# Patient Record
Sex: Female | Born: 1975 | Race: White | Hispanic: No | Marital: Married
Health system: Southern US, Community
[De-identification: ages and names within clinical notes are randomized; demographics above are authoritative.]

---

## 2003-01-17 ENCOUNTER — Other Ambulatory Visit: Admission: RE | Admit: 2003-01-17 | Discharge: 2003-01-17 | Payer: Self-pay | Admitting: Obstetrics and Gynecology

## 2003-01-18 ENCOUNTER — Ambulatory Visit (HOSPITAL_COMMUNITY): Admission: RE | Admit: 2003-01-18 | Discharge: 2003-01-18 | Payer: Self-pay | Admitting: Obstetrics and Gynecology

## 2003-01-18 ENCOUNTER — Other Ambulatory Visit: Admission: RE | Admit: 2003-01-18 | Discharge: 2003-01-18 | Payer: Self-pay | Admitting: Obstetrics and Gynecology

## 2003-01-18 ENCOUNTER — Encounter: Payer: Self-pay | Admitting: Obstetrics and Gynecology

## 2003-02-08 ENCOUNTER — Encounter: Payer: Self-pay | Admitting: Obstetrics and Gynecology

## 2003-02-08 ENCOUNTER — Ambulatory Visit (HOSPITAL_COMMUNITY): Admission: RE | Admit: 2003-02-08 | Discharge: 2003-02-08 | Payer: Self-pay | Admitting: Obstetrics and Gynecology

## 2003-06-17 ENCOUNTER — Inpatient Hospital Stay (HOSPITAL_COMMUNITY): Admission: AD | Admit: 2003-06-17 | Discharge: 2003-06-18 | Payer: Self-pay | Admitting: Obstetrics & Gynecology

## 2003-06-20 ENCOUNTER — Inpatient Hospital Stay (HOSPITAL_COMMUNITY): Admission: AD | Admit: 2003-06-20 | Discharge: 2003-06-22 | Payer: Self-pay | Admitting: Obstetrics and Gynecology

## 2008-09-27 ENCOUNTER — Ambulatory Visit (HOSPITAL_COMMUNITY): Admission: RE | Admit: 2008-09-27 | Discharge: 2008-09-27 | Payer: Self-pay | Admitting: Obstetrics and Gynecology

## 2008-09-27 IMAGING — US US OB DETAIL+14 WK
1 series · 14 of 28 positions shown · non-contrast
Comparison: none

OBSTETRICAL ULTRASOUND:
 This ultrasound exam was performed in the [HOSPITAL] Ultrasound Department.  The OB US report was generated in the AS system, and faxed to the ordering physician.  This report is also available in [REDACTED] PACS.

[Series 1: us ob detail +14 wk · 14 of 56 slices shown]
[im 3/56]
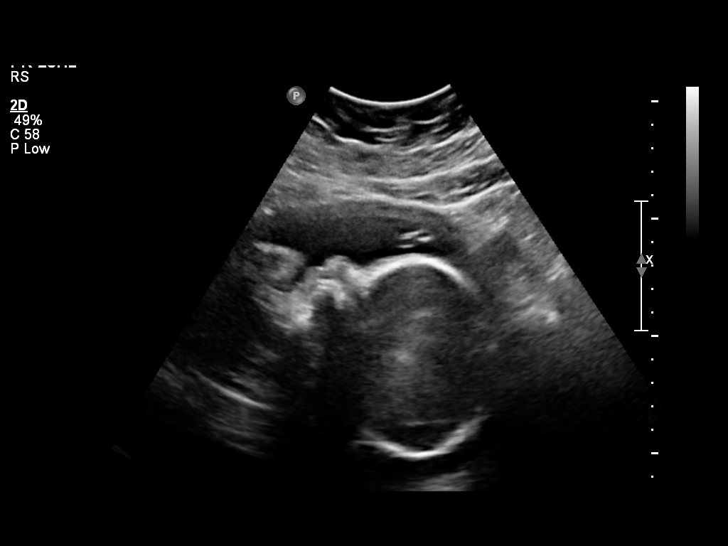
[im 7/56]
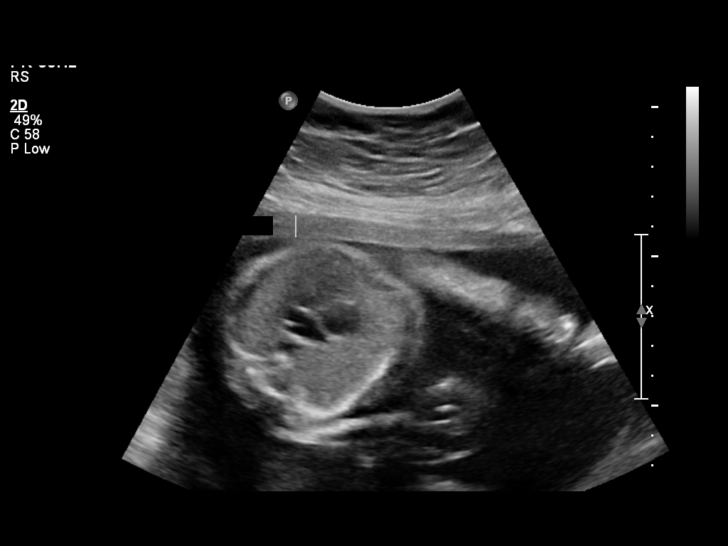
[im 11/56]
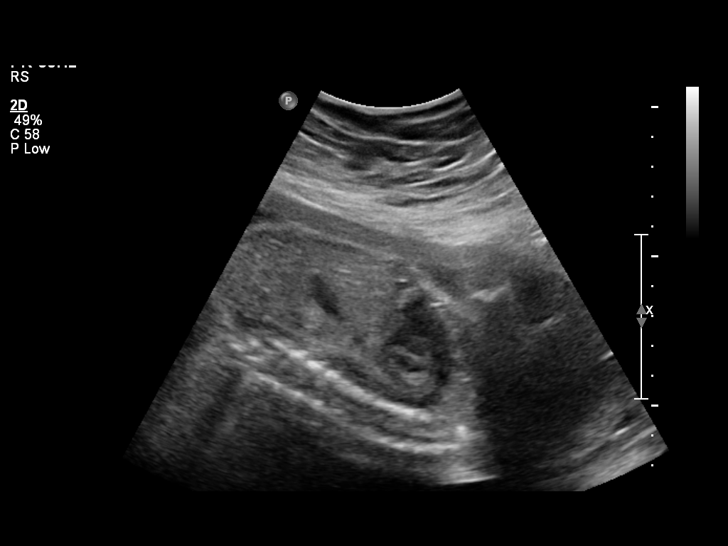
[im 15/56]
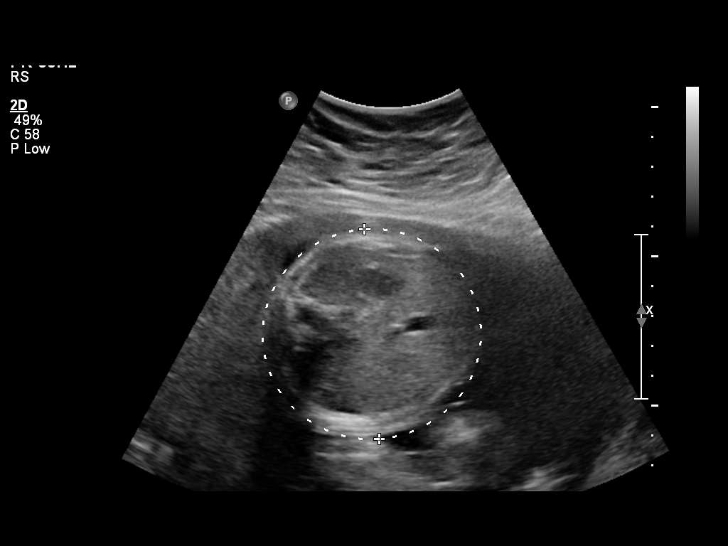
[im 19/56]
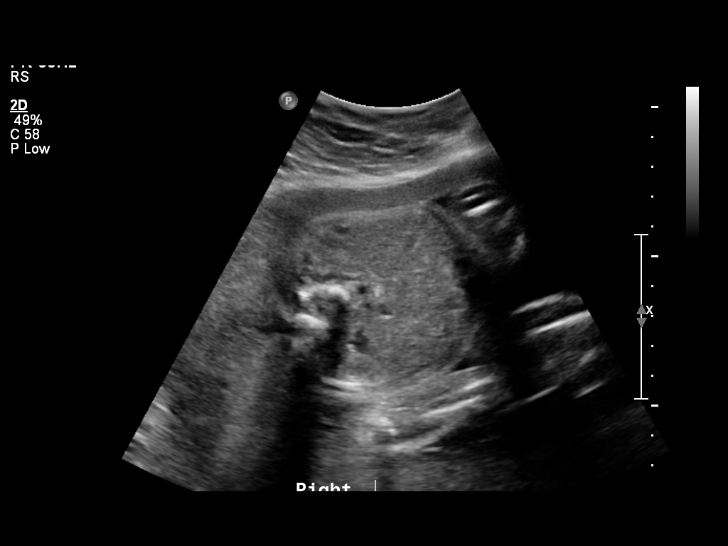
[im 23/56]
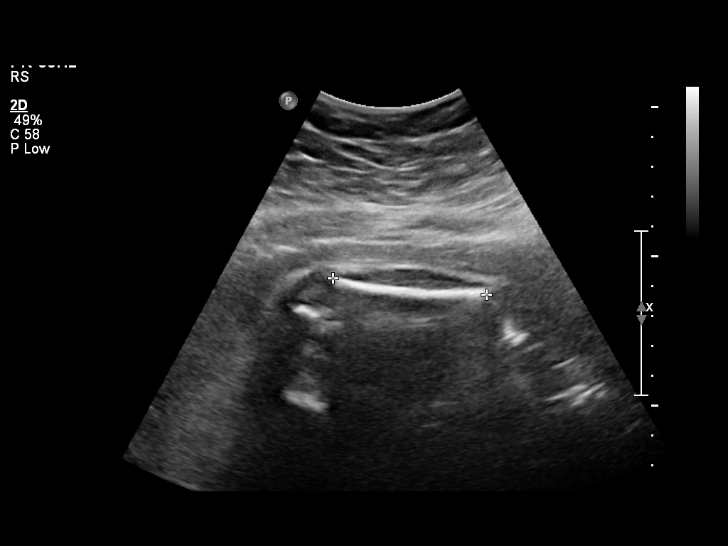
[im 27/56]
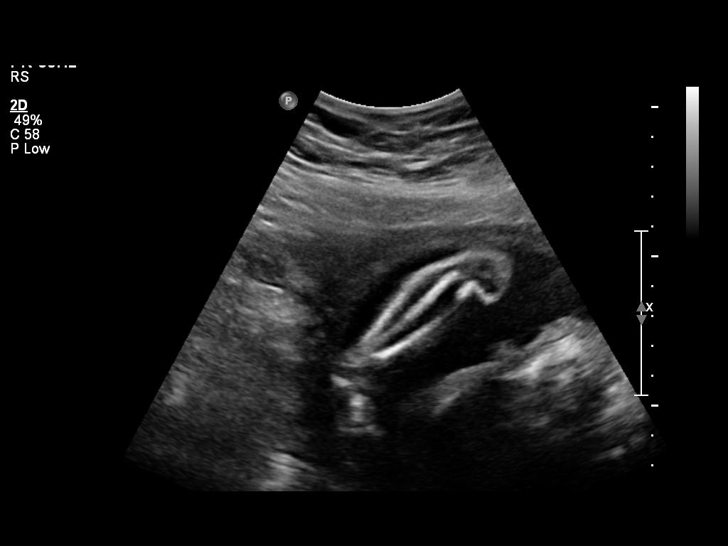
[im 31/56]
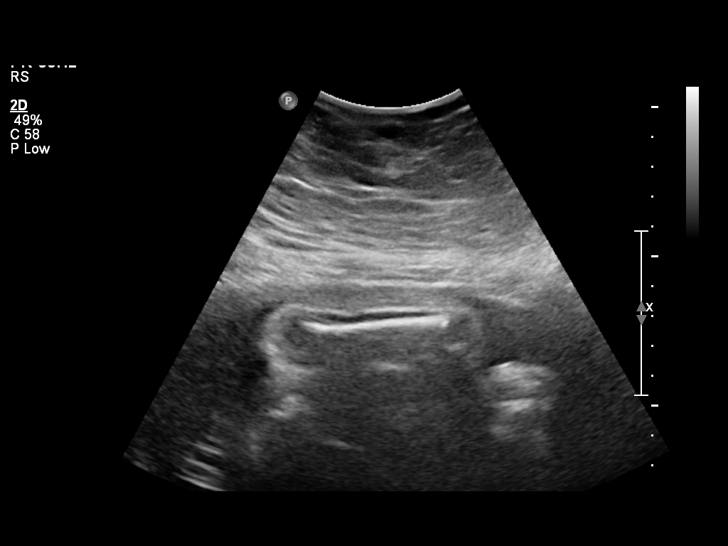
[im 35/56]
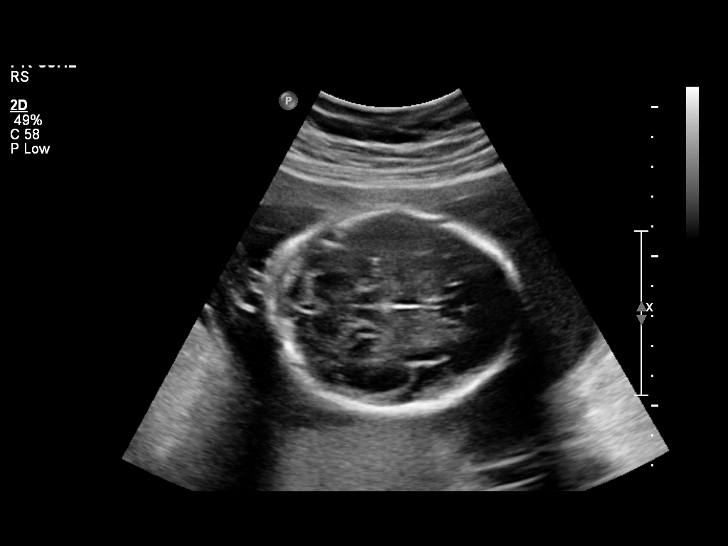
[im 39/56]
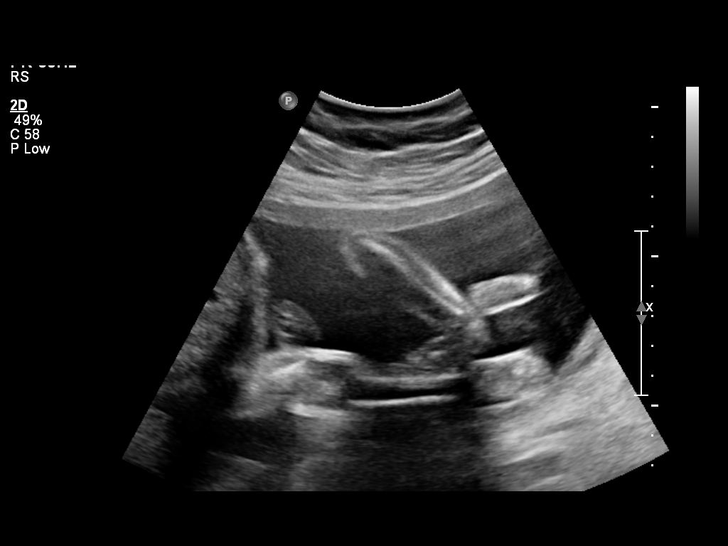
[im 43/56]
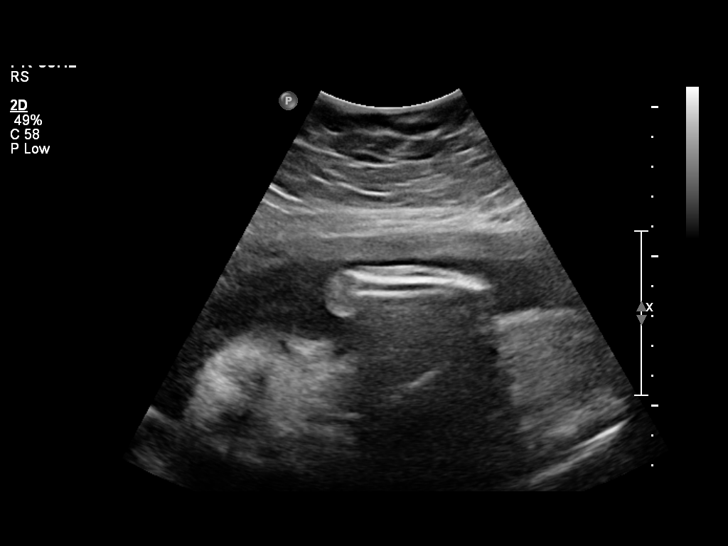
[im 47/56]
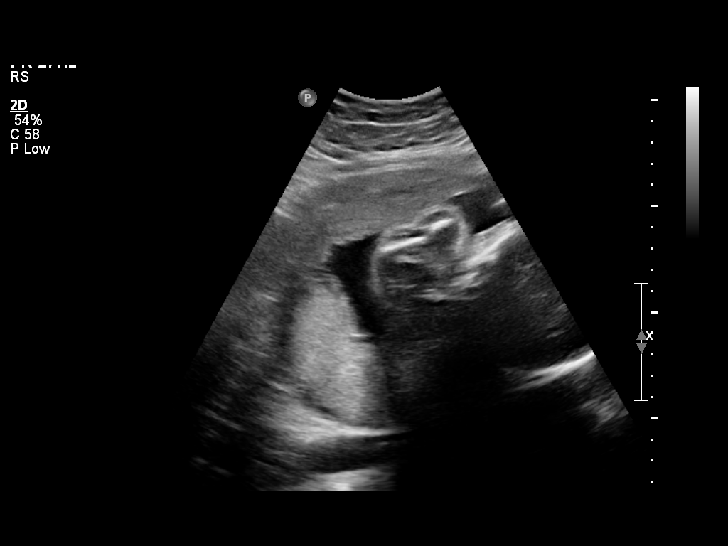
[im 51/56]
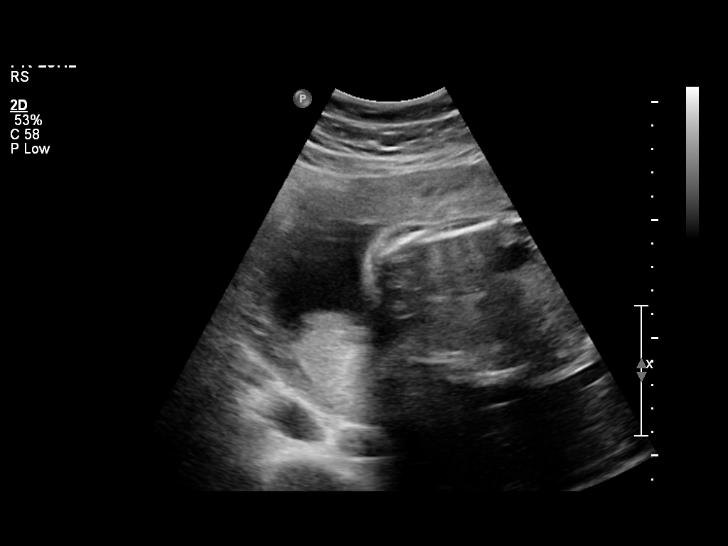
[im 56/56]
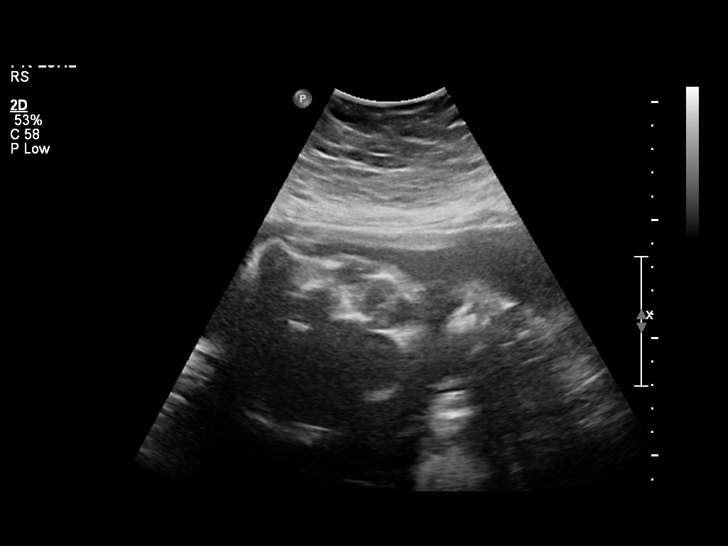

[14 of 28 positions shown; findings below may reference images not displayed]

IMPRESSION: See AS Obstetric US report.

## 2008-12-04 ENCOUNTER — Inpatient Hospital Stay (HOSPITAL_COMMUNITY): Admission: AD | Admit: 2008-12-04 | Discharge: 2008-12-06 | Payer: Self-pay | Admitting: Obstetrics and Gynecology

## 2008-12-06 ENCOUNTER — Ambulatory Visit: Payer: Self-pay | Admitting: Vascular Surgery

## 2008-12-06 ENCOUNTER — Encounter (INDEPENDENT_AMBULATORY_CARE_PROVIDER_SITE_OTHER): Payer: Self-pay | Admitting: Obstetrics & Gynecology

## 2010-09-21 LAB — CBC
HCT: 35 % — ABNORMAL LOW (ref 36.0–46.0)
Hemoglobin: 12.3 g/dL (ref 12.0–15.0)
MCHC: 35.3 g/dL (ref 30.0–36.0)
MCV: 89.5 fL (ref 78.0–100.0)
RBC: 3.56 MIL/uL — ABNORMAL LOW (ref 3.87–5.11)
RBC: 3.91 MIL/uL (ref 3.87–5.11)
WBC: 8.6 10*3/uL (ref 4.0–10.5)

## 2010-09-21 LAB — CCBB MATERNAL DONOR DRAW

## 2020-09-18 ENCOUNTER — Other Ambulatory Visit: Payer: Self-pay

## 2020-09-18 ENCOUNTER — Inpatient Hospital Stay: Payer: Self-pay | Attending: Hematology and Oncology | Admitting: Hematology and Oncology

## 2020-09-18 VITALS — BP 138/76 | HR 78 | Temp 98.4°F | Resp 18 | Ht 67.0 in | Wt 328.7 lb

## 2020-09-18 DIAGNOSIS — R928 Other abnormal and inconclusive findings on diagnostic imaging of breast: Secondary | ICD-10-CM

## 2020-09-18 NOTE — Progress Notes (Addendum)
Tanya [name] is a 45 y.o. female who presents to [facility] clinic today with no complaints .    Pap Smear: Pap not smear completed today. Last Pap smear was 08/20/2019 and was normal. Per patient has no history of an abnormal Pap smear. Last Pap smear result is not available in Epic.   Physical exam: Breasts Breasts symmetrical. No skin abnormalities bilateral breasts. No nipple retraction bilateral breasts. No nipple discharge bilateral breasts. No lymphadenopathy. No lumps palpated bilateral breasts.       Pelvic/Bimanual Pap is not indicated today    Smoking History: Patient has is a former smoker.    Patient Navigation: Patient education provided. Access to services provided for patient through [facility] program. No interpreter provided. No transportation provided   Colorectal Cancer Screening: Per patient has never had colonoscopy completed No complaints today.    Breast and Cervical Cancer Risk Assessment: Patient does not have family history of breast cancer, known genetic mutations, or radiation treatment to the chest before age 52. Patient does not have history of cervical dysplasia, immunocompromised, or DES exposure in-utero.  Risk Assessment   No risk assessment data     A: BCCCP exam without pap smear Clinical breast exam benign. 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  P: [facility] for [facility] bilateral diagnostic mammogram and right breast ultrasound. Appointment to be scheduled and patient will be notified.  Pascal Lux, NP 09/18/2020 1:55 PM

## 2020-10-28 ENCOUNTER — Encounter: Payer: Self-pay | Admitting: Hematology and Oncology

## 2020-10-29 ENCOUNTER — Encounter: Payer: Self-pay | Admitting: Hematology and Oncology

## 2022-02-17 ENCOUNTER — Inpatient Hospital Stay: Payer: Self-pay | Attending: Hematology and Oncology | Admitting: Hematology and Oncology

## 2022-02-17 ENCOUNTER — Encounter: Payer: Self-pay | Admitting: Hematology and Oncology

## 2022-02-17 NOTE — Progress Notes (Signed)
Ms. Tanya Tanya [name] is Tanya [name] 46 y.o. female who presents to [facility] clinic today with no complaints .    Pap Smear: Pap not smear completed today. Last Pap smear was 07/30/2019 at [facility] clinic and was normal. Per patient has no history of an abnormal Pap smear. Last Pap smear result is not available in Epic.   Physical exam: Breasts Breasts symmetrical. No skin abnormalities bilateral breasts. No nipple retraction bilateral breasts. No nipple discharge bilateral breasts. No lymphadenopathy. No lumps palpated bilateral breasts.       Pelvic/Bimanual Pap is not indicated today    Smoking History: Patient has never smoked but does vape. She refused referral  to quit line.    Patient Navigation: Patient education provided. Access to services provided for patient through [facility] program. No interpreter provided. No transportation provided   Colorectal Cancer Screening: Per patient has never had colonoscopy completed No complaints today. She refused FIT testing.   Breast and Cervical Cancer Risk Assessment: Patient does not have family history of breast cancer, known genetic mutations, or radiation treatment to the chest before age 67. Patient does not have history of cervical dysplasia, immunocompromised, or DES exposure in-utero.  Risk Assessment   No risk assessment data for the current encounter  Risk Scores       09/18/2020   Last edited by: Tanya Dustman, RN   5-year risk: 1.7 %   Lifetime risk: 19.1 %            Tanya [name]: BCCCP exam without pap smear No complaints today with benign exam. Previous mammogram recommending bilateral diagnostic follow up for right breast lesion, most likely benign.  P: [facility] for Tanya [name] diagnostic mammogram. Appointment to be scheduled and patient notified.  Tanya Basset A, NP 02/17/2022 10:26 AM
